# Patient Record
Sex: Male | Born: 2000 | Race: White | Hispanic: No | Marital: Single | Smoking: Never smoker
Health system: Southern US, Community
[De-identification: ages and names within clinical notes are randomized; demographics above are authoritative.]

## PROBLEM LIST (undated history)

## (undated) DIAGNOSIS — H509 Unspecified strabismus: Secondary | ICD-10-CM

## (undated) HISTORY — PX: APPENDECTOMY: SHX54

## (undated) HISTORY — DX: Unspecified strabismus: H50.9

---
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
## 2019-02-10 ENCOUNTER — Other Ambulatory Visit: Payer: Self-pay

## 2019-02-10 ENCOUNTER — Emergency Department (INDEPENDENT_AMBULATORY_CARE_PROVIDER_SITE_OTHER)
Admission: EM | Admit: 2019-02-10 | Discharge: 2019-02-10 | Disposition: A | Discharge status: Home / Self Care | Payer: BLUE CROSS/BLUE SHIELD | Source: Home / Self Care | Attending: Emergency Medicine | Admitting: Emergency Medicine

## 2019-02-10 DIAGNOSIS — S0990XA Unspecified injury of head, initial encounter: Secondary | ICD-10-CM

## 2019-02-10 DIAGNOSIS — S0101XA Laceration without foreign body of scalp, initial encounter: Secondary | ICD-10-CM | POA: Diagnosis not present

## 2019-02-10 DIAGNOSIS — Z23 Encounter for immunization: Secondary | ICD-10-CM

## 2019-02-10 MED ORDER — TETANUS-DIPHTH-ACELL PERTUSSIS 5-2.5-18.5 LF-MCG/0.5 IM SUSP
0.5000 mL | Freq: Once | INTRAMUSCULAR | Status: AC
  Administered 2019-02-10: 11:00:00 0.5 mL via INTRAMUSCULAR

## 2019-02-10 NOTE — Discharge Instructions (Addendum)
Keep area clean with soap and water. Follow the instructions on the head injury sheet. If you develop any neurological symptoms please return to clinic immediately. Follow instructions on your wound care sheet. Suture removal 7 to 10 days.

## 2019-02-10 NOTE — ED Triage Notes (Signed)
About 1 hour ago, the pt's trunk came down and hit him on the head.  Laceration a little over 1" to the top left of crown.

## 2019-02-10 NOTE — ED Provider Notes (Signed)
[facility]    CSN: [id number] Arrival date & time: 02/10/19  1006     History   Chief Complaint Chief Complaint  Patient presents with  . Laceration    HPI Craig Chan is a 18 y.o. male.  Patient was in his usual state of health until 1 hour ago when while helping his father move the trunk on his car came down and struck him on the left side of his head causing a laceration.  There was no loss of consciousness.  He has had no headache or vomiting since the episode.  Bleeding has been controlled.  Of note his last tetanus shot was at about age 21 when entering the sixth grade.  He feels fine now he has no weakness or dizziness post injury.  He did have his ball cap on when he was struck in the head.  Laceration    Past Medical History:  Diagnosis Date  . Strabismus     Patient Active Problem List   Diagnosis Date Noted  . Sports physical 08/28/2011    Past Surgical History:  Procedure Laterality Date  . APPENDECTOMY         Home Medications    Prior to Admission medications   Not on File    Family History Family History  Problem Relation Age of Onset  . Sudden death Neg Hx   . Heart attack Neg Hx     Social History Social History   Tobacco Use  . Smoking status: Never Smoker  . Smokeless tobacco: Never Used  Substance Use Topics  . Alcohol use: Yes  . Drug use: Not Currently     Allergies   Patient has no known allergies.   Review of Systems Review of Systems  Constitutional: Negative.   HENT:       He has a wound on the left parietal area  Neurological: Negative.   Psychiatric/Behavioral: Negative.      Physical Exam Triage Vital Signs ED Triage Vitals  Enc Vitals Group     BP 02/10/19 1034 (!) 141/56     Pulse Rate 02/10/19 1034 70     Resp 02/10/19 1034 20     Temp 02/10/19 1034 98.2 F (36.8 C)     Temp Source 02/10/19 1034 Oral     SpO2 02/10/19 1034 100 %     Weight 02/10/19 1035 182 lb (82.6 kg)     Height  02/10/19 1035 6\' 2"  (1.88 m)     Head Circumference --      Peak Flow --      Pain Score 02/10/19 1034 4     Pain Loc --      Pain Edu? --      Excl. in GC? --    No data found.  Updated Vital Signs BP (!) 141/56 (BP Location: Right Arm)   Pulse 70   Temp 98.2 F (36.8 C) (Oral)   Resp 20   Ht 6\' 2"  (1.88 m)   Wt 82.6 kg   SpO2 100%   BMI 23.37 kg/m   Visual Acuity Right Eye Distance:   Left Eye Distance:   Bilateral Distance:    Right Eye Near:   Left Eye Near:    Bilateral Near:     Physical Exam Constitutional:      Appearance: Normal appearance.  HENT:     Head:     Comments: There is a 3 cm wound over the superior posterior left parietal  area.  There is minimal active bleeding. Neurological:     General: No focal deficit present.     Mental Status: He is alert and oriented to person, place, and time.     Cranial Nerves: No cranial nerve deficit.     Gait: Gait normal.  Psychiatric:        Mood and Affect: Mood normal.        Behavior: Behavior normal.   Procedure note: After verbal consent the wound was cleaned with Hibiclens.  Following this 6 cc of 2% plain was injected for anesthesia.  Sterile gloves were used.  Tape was used to hold down his hair.  Sterile drapes were used to surround the wound.  Following this the wound was repaired with #5 4-0 nylon sutures.  Patient tolerated the procedure well.  All questions were answered.   UC Treatments / Results  Labs (all labs ordered are listed, but only abnormal results are displayed) Labs Reviewed - No data to display  EKG None  Radiology No results found.  Procedures Procedures (including critical care time)  Medications Ordered in UC Medications  Tdap (BOOSTRIX) injection 0.5 mL (0.5 mLs Intramuscular Given 02/10/19 1058)    Initial Impression / Assessment and Plan / UC Course  I have reviewed the triage vital signs and the nursing notes.  Pertinent labs & imaging results that were  available during my care of the patient were reviewed by me and considered in my medical decision making (see chart for details). Patient seen with laceration to the left parietal area.  He trunk head of his car head fallen down and struck him in the head.  He has no neurological complaints.  He was updated on his tetanus.  Wound was repaired with 5 simple 4-0 nylon sutures.  Patient tolerated well.  All questions were answered.  He was given a head injury sheet as well as a wound care sheet.  He will return to clinic 7 to 10 days for suture removal.     Final Clinical Impressions(s) / UC Diagnoses   Final diagnoses:  Laceration of scalp without complication, initial encounter  Injury of head, initial encounter     Discharge Instructions     Keep area clean with soap and water. Follow the instructions on the head injury sheet. If you develop any neurological symptoms please return to clinic immediately. Follow instructions on your wound care sheet. Suture removal 7 to 10 days.    ED Prescriptions    None     Controlled Substance Prescriptions [place] Controlled Substance Registry consulted? Not Applicable   Collene Gobbleaub, Kharlie Bring A, MD 02/10/19 1121

## 2019-02-18 ENCOUNTER — Emergency Department (INDEPENDENT_AMBULATORY_CARE_PROVIDER_SITE_OTHER)
Admission: EM | Admit: 2019-02-18 | Discharge: 2019-02-18 | Disposition: A | Discharge status: Home / Self Care | Payer: BLUE CROSS/BLUE SHIELD | Source: Home / Self Care | Attending: Family Medicine | Admitting: Family Medicine

## 2019-02-18 ENCOUNTER — Other Ambulatory Visit: Payer: Self-pay

## 2019-02-18 DIAGNOSIS — Z4802 Encounter for removal of sutures: Secondary | ICD-10-CM

## 2019-02-18 NOTE — ED Triage Notes (Signed)
Pt here for suture removal left crown of head.

## 2019-02-18 NOTE — ED Notes (Signed)
5 sutures removed.  Pt tolerated procedure well.

## 2019-02-18 NOTE — ED Provider Notes (Signed)
[facility]    CSN: [id number] Arrival date & time: 02/18/19  1252     History   Chief Complaint Chief Complaint  Patient presents with  . Suture / Staple Removal    HPI Craig Chan is a 18 y.o. male.   Patient returns for suture removal of laceration left scalp.  He has no complaints.  There has been no pain, or drainage from the wound.  The history is provided by the patient.    Past Medical History:  Diagnosis Date  . Strabismus     Patient Active Problem List   Diagnosis Date Noted  . Sports physical 08/28/2011    Past Surgical History:  Procedure Laterality Date  . APPENDECTOMY         Home Medications    Prior to Admission medications   Not on File    Family History Family History  Problem Relation Age of Onset  . Sudden death Neg Hx   . Heart attack Neg Hx     Social History Social History   Tobacco Use  . Smoking status: Never Smoker  . Smokeless tobacco: Never Used  Substance Use Topics  . Alcohol use: Yes  . Drug use: Not Currently     Allergies   Patient has no known allergies.   Review of Systems Review of Systems  Constitutional: Negative for chills, diaphoresis, fatigue and fever.  Neurological: Negative for headaches.  All other systems reviewed and are negative.    Physical Exam Triage Vital Signs ED Triage Vitals [02/18/19 1301]  Enc Vitals Group     BP (!) 132/55     Pulse Rate 83     Resp 20     Temp      Temp src      SpO2 99 %     Weight      Height      Head Circumference      Peak Flow      Pain Score 0     Pain Loc      Pain Edu?      Excl. in GC?    No data found.  Updated Vital Signs BP (!) 132/55   Pulse 83   Resp 20   SpO2 99%   Visual Acuity Right Eye Distance:   Left Eye Distance:   Bilateral Distance:    Right Eye Near:   Left Eye Near:    Bilateral Near:     Physical Exam Patient appears comfortable, alert, and in no distress. Left scalp parietal scalp:  Well  healed laceration without evidence infection.  Sutures removed.  UC Treatments / Results  Labs (all labs ordered are listed, but only abnormal results are displayed) Labs Reviewed - No data to display  EKG None  Radiology No results found.  Procedures Procedures (including critical care time)  Medications Ordered in UC Medications - No data to display  Initial Impression / Assessment and Plan / UC Course  I have reviewed the triage vital signs and the nursing notes.  Pertinent labs & imaging results that were available during my care of the patient were reviewed by me and considered in my medical decision making (see chart for details).    Wound well healed without evidence infection. Return prn   Final Clinical Impressions(s) / UC Diagnoses   Final diagnoses:  Visit for suture removal   Discharge Instructions   None    ED Prescriptions    None  Lattie Haw, MD 02/18/19 [phone]
